# Patient Record
Sex: Male | Born: 1995 | Race: Black or African American | Hispanic: No | Marital: Single | Smoking: Never smoker
Health system: Southern US, Community
[De-identification: ages and names within clinical notes are randomized; demographics above are authoritative.]

## PROBLEM LIST (undated history)

## (undated) DIAGNOSIS — I309 Acute pericarditis, unspecified: Secondary | ICD-10-CM

## (undated) HISTORY — DX: Acute pericarditis, unspecified: I30.9

---
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
## 2013-08-17 ENCOUNTER — Emergency Department (HOSPITAL_COMMUNITY)
Admission: EM | Admit: 2013-08-17 | Discharge: 2013-08-17 | Disposition: A | Discharge status: Home / Self Care | Payer: Self-pay | Attending: Emergency Medicine | Admitting: Emergency Medicine

## 2013-08-17 ENCOUNTER — Emergency Department (HOSPITAL_COMMUNITY): Payer: Self-pay

## 2013-08-17 DIAGNOSIS — Y9389 Activity, other specified: Secondary | ICD-10-CM | POA: Insufficient documentation

## 2013-08-17 DIAGNOSIS — Y9289 Other specified places as the place of occurrence of the external cause: Secondary | ICD-10-CM | POA: Insufficient documentation

## 2013-08-17 DIAGNOSIS — Z23 Encounter for immunization: Secondary | ICD-10-CM | POA: Insufficient documentation

## 2013-08-17 DIAGNOSIS — W3400XA Accidental discharge from unspecified firearms or gun, initial encounter: Secondary | ICD-10-CM | POA: Insufficient documentation

## 2013-08-17 DIAGNOSIS — S71009A Unspecified open wound, unspecified hip, initial encounter: Secondary | ICD-10-CM

## 2013-08-17 DIAGNOSIS — S71132A Puncture wound without foreign body, left thigh, initial encounter: Secondary | ICD-10-CM

## 2013-08-17 DIAGNOSIS — S71109A Unspecified open wound, unspecified thigh, initial encounter: Principal | ICD-10-CM | POA: Insufficient documentation

## 2013-08-17 LAB — CBC
HCT: 42.6 % (ref 39.0–52.0)
Hemoglobin: 15.1 g/dL (ref 13.0–17.0)
MCH: 31.3 pg (ref 26.0–34.0)
MCHC: 35.4 g/dL (ref 30.0–36.0)
MCV: 88.4 fL (ref 78.0–100.0)
PLATELETS: 230 10*3/uL (ref 150–400)
RBC: 4.82 MIL/uL (ref 4.22–5.81)
RDW: 12.7 % (ref 11.5–15.5)
WBC: 5.7 10*3/uL (ref 4.0–10.5)

## 2013-08-17 LAB — TYPE AND SCREEN
ABO/RH(D): B POS
Antibody Screen: NEGATIVE
UNIT DIVISION: 0
Unit division: 0

## 2013-08-17 LAB — COMPREHENSIVE METABOLIC PANEL
ALK PHOS: 93 U/L (ref 39–117)
ALT: 12 U/L (ref 0–53)
ANION GAP: 18 — AB (ref 5–15)
AST: 18 U/L (ref 0–37)
Albumin: 4.4 g/dL (ref 3.5–5.2)
BUN: 12 mg/dL (ref 6–23)
CALCIUM: 9.2 mg/dL (ref 8.4–10.5)
CO2: 23 meq/L (ref 19–32)
Chloride: 99 mEq/L (ref 96–112)
Creatinine, Ser: 1 mg/dL (ref 0.50–1.35)
GFR calc non Af Amer: >90 mL/min (ref >90)
GLUCOSE: 114 mg/dL — AB (ref 70–99)
Potassium: 3.2 mEq/L — ABNORMAL LOW (ref 3.7–5.3)
SODIUM: 140 meq/L (ref 137–147)
TOTAL PROTEIN: 7.5 g/dL (ref 6.0–8.3)
Total Bilirubin: 0.4 mg/dL (ref 0.3–1.2)

## 2013-08-17 LAB — PREPARE FRESH FROZEN PLASMA
UNIT DIVISION: 0
Unit division: 0

## 2013-08-17 LAB — I-STAT CHEM 8, ED
BUN: 11 mg/dL (ref 6–23)
CALCIUM ION: 1.14 mmol/L (ref 1.12–1.23)
Chloride: 102 mEq/L (ref 96–112)
Creatinine, Ser: 1.1 mg/dL (ref 0.50–1.35)
GLUCOSE: 121 mg/dL — AB (ref 70–99)
HCT: 48 % (ref 39.0–52.0)
HEMOGLOBIN: 16.3 g/dL (ref 13.0–17.0)
Potassium: 3 mEq/L — ABNORMAL LOW (ref 3.7–5.3)
SODIUM: 141 meq/L (ref 137–147)
TCO2: 23 mmol/L (ref 0–100)

## 2013-08-17 LAB — ETHANOL: Alcohol, Ethyl (B): <11 mg/dL (ref 0–11)

## 2013-08-17 LAB — PROTIME-INR
INR: 0.95 (ref 0.00–1.49)
Prothrombin Time: 12.7 seconds (ref 11.6–15.2)

## 2013-08-17 LAB — CDS SEROLOGY

## 2013-08-17 LAB — ABO/RH: ABO/RH(D): B POS

## 2013-08-17 MED ORDER — TRAMADOL HCL 50 MG PO TABS: 50.0000 mg | ORAL_TABLET | Freq: Four times a day (QID) | ORAL | Status: DC | PRN

## 2013-08-17 MED ORDER — MORPHINE SULFATE 2 MG/ML IJ SOLN
INTRAMUSCULAR | Status: AC
  Filled 2013-08-17: qty 1

## 2013-08-17 MED ORDER — SODIUM CHLORIDE 0.9 % IV SOLN
INTRAVENOUS | Status: AC | PRN
  Administered 2013-08-17: 500 mL via INTRAVENOUS

## 2013-08-17 MED ORDER — MORPHINE SULFATE 2 MG/ML IJ SOLN
2.0000 mg | Freq: Once | INTRAMUSCULAR | Status: AC
  Administered 2013-08-17: 2 mg via INTRAVENOUS

## 2013-08-17 MED ORDER — CEPHALEXIN 500 MG PO CAPS: 500.0000 mg | ORAL_CAPSULE | Freq: Four times a day (QID) | ORAL | Status: DC

## 2013-08-17 MED ORDER — TETANUS-DIPHTH-ACELL PERTUSSIS 5-2.5-18.5 LF-MCG/0.5 IM SUSP
0.5000 mL | Freq: Once | INTRAMUSCULAR | Status: AC
  Administered 2013-08-17: 0.5 mL via INTRAMUSCULAR
  Filled 2013-08-17: qty 0.5

## 2013-08-17 MED ORDER — TRAMADOL HCL 50 MG PO TABS
50.0000 mg | ORAL_TABLET | Freq: Four times a day (QID) | ORAL | Status: DC | PRN
Start: 2013-08-17 — End: 2017-10-29

## 2013-08-17 MED ORDER — IBUPROFEN 600 MG PO TABS: 600.0000 mg | ORAL_TABLET | Freq: Four times a day (QID) | ORAL | Status: DC | PRN
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
## 2013-08-17 NOTE — ED Notes (Signed)
Pass Word - [phone]
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
## 2017-06-09 ENCOUNTER — Emergency Department (HOSPITAL_COMMUNITY): Payer: Self-pay

## 2017-06-09 ENCOUNTER — Other Ambulatory Visit: Payer: Self-pay

## 2017-06-09 ENCOUNTER — Encounter (HOSPITAL_COMMUNITY): Payer: Self-pay | Admitting: Emergency Medicine

## 2017-06-09 ENCOUNTER — Emergency Department (HOSPITAL_COMMUNITY)
Admission: EM | Admit: 2017-06-09 | Discharge: 2017-06-09 | Disposition: A | Discharge status: Home / Self Care | Payer: Self-pay | Attending: Emergency Medicine | Admitting: Emergency Medicine

## 2017-06-09 DIAGNOSIS — Z5321 Procedure and treatment not carried out due to patient leaving prior to being seen by health care provider: Secondary | ICD-10-CM | POA: Insufficient documentation

## 2017-06-09 DIAGNOSIS — S61210A Laceration without foreign body of right index finger without damage to nail, initial encounter: Secondary | ICD-10-CM | POA: Insufficient documentation

## 2017-06-09 DIAGNOSIS — Y929 Unspecified place or not applicable: Secondary | ICD-10-CM | POA: Insufficient documentation

## 2017-06-09 DIAGNOSIS — W25XXXA Contact with sharp glass, initial encounter: Secondary | ICD-10-CM | POA: Insufficient documentation

## 2017-06-09 DIAGNOSIS — Y9389 Activity, other specified: Secondary | ICD-10-CM | POA: Insufficient documentation

## 2017-06-09 DIAGNOSIS — Y998 Other external cause status: Secondary | ICD-10-CM | POA: Insufficient documentation
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
## 2017-06-09 NOTE — ED Notes (Signed)
Pt states he is leaving to be seen at another facility. Left with dressing intact and bleeding controlled.

## 2017-10-28 ENCOUNTER — Encounter (HOSPITAL_COMMUNITY): Payer: Self-pay | Admitting: *Deleted

## 2017-10-28 ENCOUNTER — Observation Stay (HOSPITAL_COMMUNITY)
Admission: EM | Admit: 2017-10-28 | Discharge: 2017-10-29 | Disposition: A | Discharge status: Home / Self Care | Payer: Self-pay | Attending: Internal Medicine | Admitting: Internal Medicine

## 2017-10-28 ENCOUNTER — Other Ambulatory Visit: Payer: Self-pay

## 2017-10-28 ENCOUNTER — Emergency Department (HOSPITAL_COMMUNITY): Payer: Self-pay

## 2017-10-28 DIAGNOSIS — R079 Chest pain, unspecified: Secondary | ICD-10-CM

## 2017-10-28 DIAGNOSIS — I309 Acute pericarditis, unspecified: Secondary | ICD-10-CM | POA: Diagnosis present

## 2017-10-28 DIAGNOSIS — I308 Other forms of acute pericarditis: Principal | ICD-10-CM | POA: Insufficient documentation

## 2017-10-28 DIAGNOSIS — F17200 Nicotine dependence, unspecified, uncomplicated: Secondary | ICD-10-CM | POA: Insufficient documentation

## 2017-10-28 DIAGNOSIS — I319 Disease of pericardium, unspecified: Secondary | ICD-10-CM

## 2017-10-28 HISTORY — DX: Acute pericarditis, unspecified: I30.9

## 2017-10-28 LAB — BASIC METABOLIC PANEL
ANION GAP: 8 (ref 5–15)
BUN: 6 mg/dL (ref 6–20)
CALCIUM: 9 mg/dL (ref 8.9–10.3)
CO2: 25 mmol/L (ref 22–32)
CREATININE: 0.94 mg/dL (ref 0.61–1.24)
Chloride: 102 mmol/L (ref 98–111)
Glucose, Bld: 116 mg/dL — ABNORMAL HIGH (ref 70–99)
Potassium: 3.2 mmol/L — ABNORMAL LOW (ref 3.5–5.1)
SODIUM: 135 mmol/L (ref 135–145)

## 2017-10-28 LAB — RAPID URINE DRUG SCREEN, HOSP PERFORMED
Amphetamines: NOT DETECTED
BENZODIAZEPINES: NOT DETECTED
Barbiturates: NOT DETECTED
Cocaine: NOT DETECTED
OPIATES: NOT DETECTED
Tetrahydrocannabinol: POSITIVE — AB

## 2017-10-28 LAB — CBC
HCT: 43.9 % (ref 39.0–52.0)
Hemoglobin: 14.7 g/dL (ref 13.0–17.0)
MCH: 29.6 pg (ref 26.0–34.0)
MCHC: 33.5 g/dL (ref 30.0–36.0)
MCV: 88.5 fL (ref 78.0–100.0)
PLATELETS: 264 10*3/uL (ref 150–400)
RBC: 4.96 MIL/uL (ref 4.22–5.81)
RDW: 12.6 % (ref 11.5–15.5)
WBC: 7.4 10*3/uL (ref 4.0–10.5)

## 2017-10-28 LAB — C-REACTIVE PROTEIN: CRP: 4.6 mg/dL — AB (ref <1.0)

## 2017-10-28 LAB — SEDIMENTATION RATE: SED RATE: 29 mm/h — AB (ref 0–16)

## 2017-10-28 LAB — TROPONIN I: TROPONIN I: 0.82 ng/mL — AB (ref <0.03)

## 2017-10-28 MED ORDER — ASPIRIN 81 MG PO CHEW
324.0000 mg | CHEWABLE_TABLET | Freq: Once | ORAL | Status: AC
  Administered 2017-10-28: 324 mg via ORAL
  Filled 2017-10-28: qty 4

## 2017-10-28 MED ORDER — HEPARIN BOLUS VIA INFUSION
4000.0000 [IU] | Freq: Once | INTRAVENOUS | Status: DC
  Filled 2017-10-28: qty 4000

## 2017-10-28 MED ORDER — COLCHICINE 0.6 MG PO TABS
0.6000 mg | ORAL_TABLET | Freq: Two times a day (BID) | ORAL | Status: DC
  Administered 2017-10-29: 0.6 mg via ORAL
  Filled 2017-10-28 (×2): qty 1

## 2017-10-28 MED ORDER — ONDANSETRON HCL 4 MG/2ML IJ SOLN: 4.0000 mg | Freq: Four times a day (QID) | INTRAMUSCULAR | Status: DC | PRN

## 2017-10-28 MED ORDER — ACETAMINOPHEN 325 MG PO TABS: 650.0000 mg | ORAL_TABLET | ORAL | Status: DC | PRN

## 2017-10-28 MED ORDER — HEPARIN (PORCINE) IN NACL 100-0.45 UNIT/ML-% IJ SOLN
950.0000 [IU]/h | INTRAMUSCULAR | Status: DC
  Filled 2017-10-28: qty 250

## 2017-10-28 MED ORDER — GI COCKTAIL ~~LOC~~
30.0000 mL | Freq: Once | ORAL | Status: AC
  Administered 2017-10-28: 30 mL via ORAL
  Filled 2017-10-28: qty 30

## 2017-10-28 MED ORDER — IBUPROFEN 800 MG PO TABS
800.0000 mg | ORAL_TABLET | Freq: Three times a day (TID) | ORAL | Status: DC
  Administered 2017-10-29 (×2): 800 mg via ORAL
  Filled 2017-10-28 (×2): qty 1

## 2017-10-28 MED ORDER — COLCHICINE 0.6 MG PO TABS
1.2000 mg | ORAL_TABLET | Freq: Once | ORAL | Status: AC
  Administered 2017-10-28: 1.2 mg via ORAL
  Filled 2017-10-28: qty 2

## 2017-10-28 MED ORDER — HYDROCODONE-ACETAMINOPHEN 5-325 MG PO TABS
1.0000 | ORAL_TABLET | Freq: Once | ORAL | Status: AC
  Administered 2017-10-28: 1 via ORAL
  Filled 2017-10-28: qty 1

## 2017-10-28 NOTE — Consult Note (Addendum)
Cardiology Consult    Patient ID: Steven Stuart MRN: [id number], DOB/AGE: [birth year]   Admit date: 10/28/2017 Date of Consult: 10/28/2017  Primary Physician: Patient, No Pcp Per Primary Cardiologist: No primary care provider on file. Requesting Provider: Sedonia Small  Patient Profile    Steven Stuart is a 22 y.o. male with no significant past medical history presents with new onset chest pain.  He reports that he had very bad bout of flulike illness 3 days ago.  He was febrile, had a cough and felt totally knocked out.  Symptoms lasted only for 1 day.  He had no sick contacts he can remember.  Is been feeling a little bit short of breath the last 2 days.   Then this afternoon at 3 PM he developed acute onset of chest pain.  Pain got worse with lying flat and better with sitting upright.  Taking deep breaths did make a difference.  Pain is nonradiating.  He did have some numbness in his left and right hand.  He denies fever chills or night sweats in the last 24 hours.  He denies acute shortness of breath.  No lower extremity edema.  No dizziness or syncope.  No orthopnea or PND.  No recent medication use or illicit drug use.  He has quite a bit of stressors at home with work and 5 kids the youngest 90 months old.  Past Medical History   History reviewed. No pertinent past medical history.  History reviewed. No pertinent surgical history.   Allergies  No Known Allergies  Inpatient Medications    . colchicine  1.2 mg Oral Once    Family History    Uncle died at age 32 of missed heart attack  Social History    Social History   Socioeconomic History  . Marital status: Single    Spouse name: Not on file  . Number of children: Not on file  . Years of education: Not on file  . Highest education level: Not on file  Occupational History  . Not on file  Social Needs  . Financial resource strain: Not on file  . Food insecurity:    Worry: Not on file    Inability: Not on file  .  Transportation needs:    Medical: Not on file    Non-medical: Not on file  Tobacco Use  . Smoking status: Current Every Day Smoker  . Smokeless tobacco: Never Used  Substance and Sexual Activity  . Alcohol use: Yes  . Drug use: Not on file  . Sexual activity: Not on file  Lifestyle  . Physical activity:    Days per week: Not on file    Minutes per session: Not on file  . Stress: Not on file  Relationships  . Social connections:    Talks on phone: Not on file    Gets together: Not on file    Attends religious service: Not on file    Active member of club or organization: Not on file    Attends meetings of clubs or organizations: Not on file    Relationship status: Not on file  . Intimate partner violence:    Fear of current or ex partner: Not on file    Emotionally abused: Not on file    Physically abused: Not on file    Forced sexual activity: Not on file  Other Topics Concern  . Not on file  Social History Narrative  . Not on file     Review  of Systems    General:  No chills, fever, night sweats or weight changes.  Cardiovascular:  No chest pain, dyspnea on exertion, edema, orthopnea, palpitations, paroxysmal nocturnal dyspnea. Dermatological: No rash, lesions/masses Respiratory: No cough, dyspnea Urologic: No hematuria, dysuria Abdominal:   No nausea, vomiting, diarrhea, bright red blood per rectum, melena, or hematemesis Neurologic:  No visual changes, wkns, changes in mental status. All other systems reviewed and are otherwise negative except as noted above.  Physical Exam    Blood pressure (!) 160/98, pulse (!) 56, temperature 99.1 F (37.3 C), temperature source Oral, resp. rate 18, height 5' 10" (1.778 m), weight 81.6 kg, SpO2 98 %.  General: Pleasant, NAD Psych: Normal affect. Neuro: Alert and oriented X 3. Moves all extremities spontaneously. HEENT: Normal  Neck: Supple without bruits or JVD. Lungs:  Resp regular and unlabored, CTA. Heart: RRR no s3,  s4, or murmurs. Abdomen: Soft, non-tender, non-distended, BS + x 4.  Extremities: No clubbing, cyanosis or edema. DP/PT/Radials 2+ and equal bilaterally.  Labs    Troponin (Point of Care Test) No results for input(s): TROPIPOC in the last 72 hours. Recent Labs    10/28/17 1838  TROPONINI 0.82*   Lab Results  Component Value Date   WBC 7.4 10/28/2017   HGB 14.7 10/28/2017   HCT 43.9 10/28/2017   MCV 88.5 10/28/2017   PLT 264 10/28/2017    Recent Labs  Lab 10/28/17 1838  NA 135  K 3.2*  CL 102  CO2 25  BUN 6  CREATININE 0.94  CALCIUM 9.0  GLUCOSE 116*   No results found for: CHOL, HDL, LDLCALC, TRIG No results found for: DDIMER   Radiology Studies    Dg Chest 2 View  Result Date: 10/28/2017 CLINICAL DATA:  Mid chest pain for 4 hours, awoke from sleep with the pain, some shortness of breath, BILATERAL arm pain, smoker EXAM: CHEST - 2 VIEW COMPARISON:  03/30/2009 FINDINGS: Upper normal heart size. Mediastinal contours and pulmonary vascularity normal. Lungs clear. No pleural effusion or pneumothorax. Bones unremarkable. IMPRESSION: No acute abnormalities. Electronically Signed   By: Lavonia Dana M.D.   On: 10/28/2017 19:42    ECG & Cardiac Imaging    ST elevation in anterior and inferior and lateral leads.  PR elevation in aVR.- personally reviewed.  Assessment & Plan    Chest pain: This presentation is most likely due to myopericarditis.  He does have physical exam signs suggestive of pericarditis.  His troponin elevation is suggestive of the myocarditis portion.  His ECG supports pericarditis.  He is currently in no chest pain following a GI cocktail.  He is free of any physical distress.  I performed a bedside ultrasound of his heart.  He had good windows.  His LV function and RV function were preserved.  He had mild pericardial effusion.  Recommendations: -Admit for observation overnight -Official TTE -Serial enzymes and ECG -Can start on colchicine and treat him  for the next month -Stop heparin -Recommend checking ESR, CRP, flu and flulike viruses.  Signed, Cristina Gong, MD 10/28/2017, 9:00 PM  For questions or updates, please contact   Please consult [email or web address] for contact info under Cardiology/STEMI.

## 2017-10-28 NOTE — ED Provider Notes (Signed)
[facility] Emergency Department Provider Note MRN:  [id number]  Arrival date & time: 10/28/17     Chief Complaint   Chest Pain   History of Present Illness   Steven Stuart is a 22 y.o. year-old male with no pertinent past medical history presenting to the ED with chief complaint of chest pain.  The pain is located in the central chest.  Described as a pressure-like sensation.  The pain is nonradiating.  It began 4 to 5 hours ago suddenly.  Worse with laying flat.  Not worse with deep breathing.  No associated symptoms.  Recent viral illness 1 to 2 weeks ago.  Review of Systems  A complete 10 system review of systems was obtained and all systems are negative except as noted in the HPI and PMH.   Patient's Health History   History reviewed. No pertinent past medical history.  History reviewed. No pertinent surgical history.  Family History  Problem Relation Age of Onset  . Heart attack Father 65    Social History   Socioeconomic History  . Marital status: Single    Spouse name: Not on file  . Number of children: Not on file  . Years of education: Not on file  . Highest education level: Not on file  Occupational History  . Not on file  Social Needs  . Financial resource strain: Not on file  . Food insecurity:    Worry: Not on file    Inability: Not on file  . Transportation needs:    Medical: Not on file    Non-medical: Not on file  Tobacco Use  . Smoking status: Current Every Day Smoker  . Smokeless tobacco: Never Used  Substance and Sexual Activity  . Alcohol use: Yes  . Drug use: Not on file  . Sexual activity: Not on file  Lifestyle  . Physical activity:    Days per week: Not on file    Minutes per session: Not on file  . Stress: Not on file  Relationships  . Social connections:    Talks on phone: Not on file    Gets together: Not on file    Attends religious service: Not on file    Active member of club or organization: Not on file   Attends meetings of clubs or organizations: Not on file    Relationship status: Not on file  . Intimate partner violence:    Fear of current or ex partner: Not on file    Emotionally abused: Not on file    Physically abused: Not on file    Forced sexual activity: Not on file  Other Topics Concern  . Not on file  Social History Narrative  . Not on file     Physical Exam  Vital Signs and Nursing Notes reviewed Vitals:   10/28/17 2200 10/28/17 2233  BP: 126/82 121/86  Pulse: (!) 51 (!) 52  Resp: 19 17  Temp:  98.4 F (36.9 C)  SpO2: 98% 99%    CONSTITUTIONAL: Well-appearing, NAD NEURO:  Alert and oriented x 3, no focal deficits EYES:  eyes equal and reactive ENT/NECK:  no LAD, no JVD CARDIO: Regular rate, well-perfused, normal S1 and S2, anterior chest nontender to palpation PULM:  CTAB no wheezing or rhonchi GI/GU:  normal bowel sounds, non-distended, non-tender MSK/SPINE:  No gross deformities, no edema SKIN:  no rash, atraumatic PSYCH:  Appropriate speech and behavior  Diagnostic and Interventional Summary    EKG Interpretation  Date/Time:  Saturday October 28 2017 18:33:10 EDT Ventricular Rate:  55 PR Interval:  72 QRS Duration: 96 QT Interval:  404 QTC Calculation: 386 R Axis:   96 Text Interpretation:  Sinus bradycardia with short PR Rightward axis Borderline ECG Confirmed by Kennis Carina [phone]) on 10/28/2017 7:21:21 PM       EKG Interpretation  Date/Time:  Saturday October 28 2017 19:58:49 EDT Ventricular Rate:  51 PR Interval:  124 QRS Duration: 100 QT Interval:  408 QTC Calculation: 376 R Axis:   89 Text Interpretation:  Sinus bradycardia Septal infarct , age undetermined Abnormal ECG Confirmed by Kennis Carina [phone]) on 10/28/2017 7:59:13 PM       Labs Reviewed  BASIC METABOLIC PANEL - Abnormal; Notable for the following components:      Result Value   Potassium 3.2 (*)    Glucose, Bld 116 (*)    All other components within normal limits    TROPONIN I - Abnormal; Notable for the following components:   Troponin I 0.82 (*)    All other components within normal limits  RAPID URINE DRUG SCREEN, HOSP PERFORMED - Abnormal; Notable for the following components:   Tetrahydrocannabinol POSITIVE (*)    All other components within normal limits  SEDIMENTATION RATE - Abnormal; Notable for the following components:   Sed Rate 29 (*)    All other components within normal limits  C-REACTIVE PROTEIN - Abnormal; Notable for the following components:   CRP 4.6 (*)    All other components within normal limits  RESPIRATORY PANEL BY PCR  CBC  HIV ANTIBODY (ROUTINE TESTING W REFLEX)  TROPONIN I  TROPONIN I    DG Chest 2 View  Final Result      Medications  colchicine tablet 0.6 mg (has no administration in time range)  ibuprofen (ADVIL,MOTRIN) tablet 800 mg (has no administration in time range)  acetaminophen (TYLENOL) tablet 650 mg (has no administration in time range)  ondansetron (ZOFRAN) injection 4 mg (has no administration in time range)  gi cocktail (Maalox,Lidocaine,Donnatal) (30 mLs Oral Given 10/28/17 1947)  HYDROcodone-acetaminophen (NORCO/VICODIN) 5-325 MG per tablet 1 tablet (1 tablet Oral Given 10/28/17 1946)  aspirin chewable tablet 324 mg (324 mg Oral Given 10/28/17 1949)  colchicine tablet 1.2 mg (1.2 mg Oral Given 10/28/17 2100)     Procedures Critical Care  ED Course and Medical Decision Making  I have reviewed the triage vital signs and the nursing notes.  Pertinent labs & imaging results that were available during my care of the patient were reviewed by me and considered in my medical decision making (see below for details).  Favoring GI origin of pain given the worse with laying flat in nature, will trial GI cocktail.  However patient appears uncomfortable, examined consistent with MSK pain, will screen with EKG, labs.  Not hypoxic, not tachycardic, no evidence of DVT, PERC negative.  EKG with J-point elevation  inferiorly, troponin elevated 0.8.  Patient given aspirin, feeling better after GI cocktail.  Cardiology consulted.  Repeat EKG showing more diffuse ST elevations with favorable morphology suggesting myopericarditis.  Patient has had a recent viral syndrome.  Cardiology recommending serial EKGs, trend troponins, colchicine.  Will admit to medicine.  Elmer Sow. Pilar Plate, MD [facility] Emergency Medicine [facility] [email or web address]  Final Clinical Impressions(s) / ED Diagnoses     ICD-10-CM   1. Myopericarditis I31.9   2. Chest pain, unspecified type R07.9     ED Discharge Orders    None  Sabas Sous, MD 10/28/17 2235

## 2017-10-28 NOTE — ED Notes (Signed)
Pt complains of chest pain while lying down. Pt reports no hx of same.

## 2017-10-28 NOTE — ED Triage Notes (Signed)
The pt is c/o pain in his mid-chest since he woke up 4 hours ago  No pat hx  /sob  None present now

## 2017-10-28 NOTE — ED Notes (Signed)
Pain is worse when lying  He denies drug use today  He smoked pot yesterday

## 2017-10-28 NOTE — ED Notes (Signed)
Pt transported to XR. Unable to administer medications as this time.

## 2017-10-28 NOTE — H&P (Signed)
History and Physical    Steven Stuart YQM:[id number] DOB: August 23, 1995 DOA: 10/28/2017  PCP: Patient, No Pcp Per  Patient coming from: Home  I have personally briefly reviewed patient's old medical records in [facility]  Chief Complaint: Chest pain  HPI: Steven Stuart is a 22 y.o. male with medical history significant of previously healthy.  Patient had URI 3 days ago with fever, cough.  Symptoms lasted for about 1 day.  SOB for past 2 days, developed CP at 3pm today.  Pain worse lying flat and better sitting upright.  No radiation.  No peripheral edema.   ED Course: EKG showing ST elevations in multi leads, Trop 0.83.  Cards came down, bedside echo confirms small pericardial effusion.  Patient being admitted for myopericarditis.   Review of Systems: As per HPI otherwise 10 point review of systems negative.   History reviewed. No pertinent past medical history.  History reviewed. No pertinent surgical history.   reports that he has been smoking. He has never used smokeless tobacco. He reports that he drinks alcohol. His drug history is not on file.  No Known Allergies  Family History  Problem Relation Age of Onset  . Heart attack Father 33     Prior to Admission medications   Medication Sig Start Date End Date Taking? Authorizing Provider  aspirin-acetaminophen-caffeine (EXCEDRIN MIGRAINE) [phone] MG tablet Take 1 tablet by mouth daily as needed for headache.   Yes [provider]  cephALEXin (KEFLEX) 500 MG capsule Take 1 capsule (500 mg total) by mouth 4 (four) times daily. Patient not taking: Reported on 10/28/2017 08/17/13   Derwood Kaplan, MD  cephALEXin (KEFLEX) 500 MG capsule Take 1 capsule (500 mg total) by mouth 4 (four) times daily. Patient not taking: Reported on 10/28/2017 08/17/13   Derwood Kaplan, MD  ibuprofen (ADVIL,MOTRIN) 600 MG tablet Take 1 tablet (600 mg total) by mouth every 6 (six) hours as needed. Patient not taking: Reported on 10/28/2017  08/17/13   Derwood Kaplan, MD  traMADol (ULTRAM) 50 MG tablet Take 1 tablet (50 mg total) by mouth every 6 (six) hours as needed. Patient not taking: Reported on 10/28/2017 08/17/13   Derwood Kaplan, MD  traMADol (ULTRAM) 50 MG tablet Take 1 tablet (50 mg total) by mouth every 6 (six) hours as needed. Patient not taking: Reported on 10/28/2017 08/17/13   Derwood Kaplan, MD    Physical Exam: Vitals:   10/28/17 1833 10/28/17 1838  BP: (!) 160/98   Pulse: (!) 56   Resp: 18   Temp: 99.1 F (37.3 C)   TempSrc: Oral   SpO2: 98%   Weight:  81.6 kg  Height:  5\' 10"  (1.778 m)    Constitutional: NAD, calm, comfortable Eyes: PERRL, lids and conjunctivae normal ENMT: Mucous membranes are moist. Posterior pharynx clear of any exudate or lesions.Normal dentition.  Neck: normal, supple, no masses, no thyromegaly Respiratory: clear to auscultation bilaterally, no wheezing, no crackles. Normal respiratory effort. No accessory muscle use.  Cardiovascular: Regular rate and rhythm, no murmurs / rubs / gallops. No extremity edema. 2+ pedal pulses. No carotid bruits.  Abdomen: no tenderness, no masses palpated. No hepatosplenomegaly. Bowel sounds positive.  Musculoskeletal: no clubbing / cyanosis. No joint deformity upper and lower extremities. Good ROM, no contractures. Normal muscle tone.  Skin: no rashes, lesions, ulcers. No induration Neurologic: CN 2-12 grossly intact. Sensation intact, DTR normal. Strength 5/5 in all 4.  Psychiatric: Normal judgment and insight. Alert and oriented x 3. Normal mood.  Labs on Admission: I have personally reviewed following labs and imaging studies  CBC: Recent Labs  Lab 10/28/17 1838  WBC 7.4  HGB 14.7  HCT 43.9  MCV 88.5  PLT 264   Basic Metabolic Panel: Recent Labs  Lab 10/28/17 1838  NA 135  K 3.2*  CL 102  CO2 25  GLUCOSE 116*  BUN 6  CREATININE 0.94  CALCIUM 9.0   GFR: Estimated Creatinine Clearance: 125.1 mL/min (by C-G formula based  on SCr of 0.94 mg/dL). Liver Function Tests: No results for input(s): AST, ALT, ALKPHOS, BILITOT, PROT, ALBUMIN in the last 168 hours. No results for input(s): LIPASE, AMYLASE in the last 168 hours. No results for input(s): AMMONIA in the last 168 hours. Coagulation Profile: No results for input(s): INR, PROTIME in the last 168 hours. Cardiac Enzymes: Recent Labs  Lab 10/28/17 1838  TROPONINI 0.82*   BNP (last 3 results) No results for input(s): PROBNP in the last 8760 hours. HbA1C: No results for input(s): HGBA1C in the last 72 hours. CBG: No results for input(s): GLUCAP in the last 168 hours. Lipid Profile: No results for input(s): CHOL, HDL, LDLCALC, TRIG, CHOLHDL, LDLDIRECT in the last 72 hours. Thyroid Function Tests: No results for input(s): TSH, T4TOTAL, FREET4, T3FREE, THYROIDAB in the last 72 hours. Anemia Panel: No results for input(s): VITAMINB12, FOLATE, FERRITIN, TIBC, IRON, RETICCTPCT in the last 72 hours. Urine analysis:    Component Value Date/Time   COLORURINE AMBER (A) 04/30/2011 0053   APPEARANCEUR CLOUDY (A) 04/30/2011 0053   LABSPEC 1.040 (H) 04/30/2011 0053   PHURINE 6.5 04/30/2011 0053   GLUCOSEU NEGATIVE 04/30/2011 0053   HGBUR NEGATIVE 04/30/2011 0053   BILIRUBINUR SMALL (A) 04/30/2011 0053   KETONESUR 15 (A) 04/30/2011 0053   PROTEINUR 100 (A) 04/30/2011 0053   UROBILINOGEN 1.0 04/30/2011 0053   NITRITE NEGATIVE 04/30/2011 0053   LEUKOCYTESUR NEGATIVE 04/30/2011 0053    Radiological Exams on Admission: Dg Chest 2 View  Result Date: 10/28/2017 CLINICAL DATA:  Mid chest pain for 4 hours, awoke from sleep with the pain, some shortness of breath, BILATERAL arm pain, smoker EXAM: CHEST - 2 VIEW COMPARISON:  03/30/2009 FINDINGS: Upper normal heart size. Mediastinal contours and pulmonary vascularity normal. Lungs clear. No pleural effusion or pneumothorax. Bones unremarkable. IMPRESSION: No acute abnormalities. Electronically Signed   By: Ulyses Southward  M.D.   On: 10/28/2017 19:42    EKG: Independently reviewed.  Assessment/Plan Active Problems:   Acute myopericarditis    1. Acute myopericarditis - 1. Tele monitor 2. Serial trops 3. Colchicine 4. Will add Ibuprofen 5. EKG repeat in AM  DVT prophylaxis: SCDs Code Status: Full Family Communication: No family in room Disposition Plan: Home after admit Consults called: Cards Admission status: Place in obs   Hillary Bow. DO [facility] Pager [phone] Only works nights!  If 7AM-7PM, please contact the primary day team physician taking care of patient  [email or web address] Password [email or web address]  10/28/2017, 9:54 PM

## 2017-10-28 NOTE — Progress Notes (Signed)
ANTICOAGULATION CONSULT NOTE - Initial Consult  Pharmacy Consult for heparin Indication: chest pain/ACS  No Known Allergies  Patient Measurements: Height: 5\' 10"  (177.8 cm) Weight: 180 lb (81.6 kg) IBW/kg (Calculated) : 73 Heparin Dosing Weight: 82kg  Vital Signs: Temp: 99.1 F (37.3 C) (10/05 1833) Temp Source: Oral (10/05 1833) BP: 160/98 (10/05 1833) Pulse Rate: 56 (10/05 1833)  Labs: Recent Labs    10/28/17 1838  HGB 14.7  HCT 43.9  PLT 264  CREATININE 0.94  TROPONINI 0.82*    Estimated Creatinine Clearance: 125.1 mL/min (by C-G formula based on SCr of 0.94 mg/dL).   Medical History: History reviewed. No pertinent past medical history.   Assessment: 16 yoM admitted with CP and elevated troponins. Pharmacy consulted to start IV heparin. CBC wnl, no OAC PTA.  Goal of Therapy:  Heparin level 0.3-0.7 units/ml Monitor platelets by anticoagulation protocol: Yes   Plan:  -Heparin 4000 units x1 -Heparin 950 units/hr -Check 6-hr heparin level -Monitor heparin level, CBC, S/Sx bleeding daily  Fredonia Highland, PharmD, BCPS Clinical Pharmacist [phone] Please check AMION for all [facility] Pharmacy numbers 10/28/2017

## 2017-10-29 ENCOUNTER — Observation Stay (HOSPITAL_BASED_OUTPATIENT_CLINIC_OR_DEPARTMENT_OTHER): Payer: Self-pay

## 2017-10-29 ENCOUNTER — Other Ambulatory Visit: Payer: Self-pay

## 2017-10-29 DIAGNOSIS — I309 Acute pericarditis, unspecified: Secondary | ICD-10-CM

## 2017-10-29 LAB — ECHOCARDIOGRAM COMPLETE
Height: 70 in
Weight: 3081.6 oz

## 2017-10-29 LAB — RESPIRATORY PANEL BY PCR
Adenovirus: NOT DETECTED
Bordetella pertussis: NOT DETECTED
CORONAVIRUS 229E-RVPPCR: NOT DETECTED
CORONAVIRUS OC43-RVPPCR: NOT DETECTED
Chlamydophila pneumoniae: NOT DETECTED
Coronavirus HKU1: NOT DETECTED
Coronavirus NL63: NOT DETECTED
INFLUENZA B-RVPPCR: NOT DETECTED
Influenza A: NOT DETECTED
MYCOPLASMA PNEUMONIAE-RVPPCR: NOT DETECTED
Metapneumovirus: NOT DETECTED
PARAINFLUENZA VIRUS 1-RVPPCR: NOT DETECTED
Parainfluenza Virus 2: NOT DETECTED
Parainfluenza Virus 3: NOT DETECTED
Parainfluenza Virus 4: NOT DETECTED
RESPIRATORY SYNCYTIAL VIRUS-RVPPCR: NOT DETECTED
Rhinovirus / Enterovirus: NOT DETECTED

## 2017-10-29 LAB — MAGNESIUM: MAGNESIUM: 2.2 mg/dL (ref 1.7–2.4)

## 2017-10-29 LAB — TROPONIN I
TROPONIN I: 2.96 ng/mL — AB (ref <0.03)
TROPONIN I: 2.11 ng/mL — AB (ref <0.03)

## 2017-10-29 LAB — BASIC METABOLIC PANEL
Anion gap: 7 (ref 5–15)
BUN: 5 mg/dL — AB (ref 6–20)
CO2: 28 mmol/L (ref 22–32)
CREATININE: 1.01 mg/dL (ref 0.61–1.24)
Calcium: 8.7 mg/dL — ABNORMAL LOW (ref 8.9–10.3)
Chloride: 102 mmol/L (ref 98–111)
GFR calc Af Amer: >60 mL/min (ref >60)
Glucose, Bld: 95 mg/dL (ref 70–99)
POTASSIUM: 3.2 mmol/L — AB (ref 3.5–5.1)
SODIUM: 137 mmol/L (ref 135–145)

## 2017-10-29 LAB — HIV ANTIBODY (ROUTINE TESTING W REFLEX): HIV SCREEN 4TH GENERATION: NONREACTIVE

## 2017-10-29 MED ORDER — POTASSIUM CHLORIDE CRYS ER 20 MEQ PO TBCR
40.0000 meq | EXTENDED_RELEASE_TABLET | Freq: Once | ORAL | Status: AC
  Administered 2017-10-29: 40 meq via ORAL
  Filled 2017-10-29: qty 2

## 2017-10-29 MED ORDER — PANTOPRAZOLE SODIUM 40 MG PO TBEC: 40.0000 mg | DELAYED_RELEASE_TABLET | Freq: Every day | ORAL | 0 refills | Status: AC

## 2017-10-29 MED ORDER — POTASSIUM CHLORIDE CRYS ER 20 MEQ PO TBCR
20.0000 meq | EXTENDED_RELEASE_TABLET | Freq: Once | ORAL | Status: AC
  Administered 2017-10-29: 20 meq via ORAL
  Filled 2017-10-29: qty 1

## 2017-10-29 MED ORDER — IBUPROFEN 600 MG PO TABS: 600.0000 mg | ORAL_TABLET | Freq: Two times a day (BID) | ORAL | 0 refills | Status: AC

## 2017-10-29 MED ORDER — COLCHICINE 0.6 MG PO TABS: 0.6000 mg | ORAL_TABLET | Freq: Two times a day (BID) | ORAL | 0 refills | Status: AC

## 2017-10-29 NOTE — Discharge Instructions (Signed)
Follow with Primary MD  in 7 days   Get CBC, CMP, by Primary MD  in 5-7 days    Activity: As tolerated with Full fall precautions use walker/cane & assistance as needed  Disposition Home    Diet: Heart Healthy    Special Instructions: If you have smoked or chewed Tobacco  in the last 2 yrs please stop smoking, stop any regular Alcohol  and or any Recreational drug use.  On your next visit with your primary care physician please Get Medicines reviewed and adjusted.  Please request your Prim.MD to go over all Hospital Tests and Procedure/Radiological results at the follow up, please get all Hospital records sent to your Prim MD by signing hospital release before you go home.  If you experience worsening of your admission symptoms, develop shortness of breath, life threatening emergency, suicidal or homicidal thoughts you must seek medical attention immediately by calling 911 or calling your MD immediately  if symptoms less severe.  You Must read complete instructions/literature along with all the possible adverse reactions/side effects for all the Medicines you take and that have been prescribed to you. Take any new Medicines after you have completely understood and accpet all the possible adverse reactions/side effects.

## 2017-10-29 NOTE — Progress Notes (Signed)
Progress Note  Patient Name: Steven Stuart Date of Encounter: 10/29/2017  Primary Cardiologist: Donato Schultz, MD   Subjective   Recent acute chest pain yesterday afternoon worse with laying flat better sitting upright no change with deep breathing.  Recent flulike illness.  Currently laying flat, comfortable in bed.  Ready to take a shower.  Feels much better.  Inpatient Medications    Scheduled Meds: . colchicine  0.6 mg Oral BID  . ibuprofen  800 mg Oral TID   Continuous Infusions:  PRN Meds: acetaminophen, ondansetron (ZOFRAN) IV   Vital Signs    Vitals:   10/28/17 2233 10/29/17 0505 10/29/17 0700 10/29/17 0759  BP: 121/86 116/81  116/63  Pulse: (!) 52 (!) 53 (!) 51 (!) 51  Resp: 17 (!) 21    Temp: 98.4 F (36.9 C) (!) 97.5 F (36.4 C)  97.7 F (36.5 C)  TempSrc: Oral Oral  Oral  SpO2: 99% 100%  99%  Weight:  87.4 kg    Height:        Intake/Output Summary (Last 24 hours) at 10/29/2017 1046 Last data filed at 10/28/2017 2300 Gross per 24 hour  Intake 360 ml  Output -  Net 360 ml   Filed Weights   10/28/17 1838 10/29/17 0505  Weight: 81.6 kg 87.4 kg    Telemetry    Sinus bradycardia, sinus rhythm, no adverse arrhythmias- Personally Reviewed  ECG    Diffuse J-point ST segment elevation, mildly improved this morning- Personally Reviewed  Physical Exam   GEN: No acute distress.   Neck: No JVD Cardiac: RRR, no murmurs, no rubs, or gallops.  Respiratory: Clear to auscultation bilaterally. GI: Soft, nontender, non-distended  MS: No edema; No deformity. Neuro:  Nonfocal  Psych: Normal affect   Labs    Chemistry Recent Labs  Lab 10/28/17 1838 10/29/17 0704  NA 135 137  K 3.2* 3.2*  CL 102 102  CO2 25 28  GLUCOSE 116* 95  BUN 6 5*  CREATININE 0.94 1.01  CALCIUM 9.0 8.7*  GFRNONAA >60 >60  GFRAA >60 >60  ANIONGAP 8 7     Hematology Recent Labs  Lab 10/28/17 1838  WBC 7.4  RBC 4.96  HGB 14.7  HCT 43.9  MCV 88.5  MCH 29.6    MCHC 33.5  RDW 12.6  PLT 264    Cardiac Enzymes Recent Labs  Lab 10/28/17 1838 10/29/17 0238 10/29/17 0704  TROPONINI 0.82* 2.96* 2.11*   No results for input(s): TROPIPOC in the last 168 hours.   BNPNo results for input(s): BNP, PROBNP in the last 168 hours.   DDimer No results for input(s): DDIMER in the last 168 hours.   Radiology    Dg Chest 2 View  Result Date: 10/28/2017 CLINICAL DATA:  Mid chest pain for 4 hours, awoke from sleep with the pain, some shortness of breath, BILATERAL arm pain, smoker EXAM: CHEST - 2 VIEW COMPARISON:  03/30/2009 FINDINGS: Upper normal heart size. Mediastinal contours and pulmonary vascularity normal. Lungs clear. No pleural effusion or pneumothorax. Bones unremarkable. IMPRESSION: No acute abnormalities. Electronically Signed   By: Ulyses Southward M.D.   On: 10/28/2017 19:42    Cardiac Studies   Echocardiogram 10/29/2017: - Normal EF 50 to 55%.  No pericardial effusion.  Reassuring  Patient Profile     22 y.o. male with acute myopericarditis  Assessment & Plan    Acute myopericarditis -Recent viral illness is the culprit.  Currently chest pain-free. - Agree with colchicine,  ibuprofen for a total of 10 days. -Chest pain has already resolved with anti-inflammatory. -He feels well ambulating well.  Would like to take a shower.  This is fine with me. -Peak troponin 2.8, currently trending downward -Echocardiogram overall reassuring with no evidence of pericardial effusion.  Normal ejection fraction. -No adverse arrhythmias on telemetry. - Sed rate 29, fairly insignificant.  CRP 4.6 mildly elevated.  White blood cell count normal.  Respiratory viral panel was negative - Would advise that he refrain from heavy physical/exertional activity for the next 10 days.  Hypokalemia -Repleting.  THC positive - Encourage smoking cessation  I am comfortable with his discharge home.  We will have follow-up arranged.  [facility] will sign off.    Medication Recommendations: Colchicine, ibuprofen for a total of 10 days Other recommendations (labs, testing, etc): No further labs Follow up as an outpatient: We will arrange follow-up in our clinic.     For questions or updates, please contact [facility] Please consult [email or web address] for contact info under        Signed, Donato Schultz, MD  10/29/2017, 10:46 AM

## 2017-10-29 NOTE — Progress Notes (Signed)
Troponin 2.96, reported to NP Blount.  No new orders given.  Patient asleep and asymptomatic.

## 2017-10-29 NOTE — Progress Notes (Signed)
  Echocardiogram 2D Echocardiogram has been performed.  Steven Stuart L Androw 10/29/2017, 10:32 AM

## 2017-10-29 NOTE — Discharge Summary (Signed)
Steven Stuart NOM:[id number] DOB: August 30, 1995 DOA: 10/28/2017  PCP: Patient, No Pcp Per  Admit date: 10/28/2017  Discharge date: 10/29/2017  Admitted From: Home   Disposition:  Home   Recommendations for Outpatient Follow-up:   Follow up with PCP in 1-2 weeks  PCP Please obtain BMP/CBC, 2 view CXR in 1week,  (see Discharge instructions)   PCP Please follow up on the following pending results:    Home Health: None   Equipment/Devices: None  Consultations: Cards Discharge Condition: Stable   CODE STATUS: Full Diet Recommendation: Heart Healthy    Chief Complaint  Patient presents with  . Chest Pain     Brief history of present illness from the day of admission and additional interim summary    Steven Stuart is a 22 y.o. male with medical history significant of previously healthy.  Patient had URI 3 days ago with fever, cough.  Symptoms lasted for about 1 day.  SOB for past 2 days, developed CP at 3pm today.  Pain worse lying flat and better sitting upright.  No radiation.  No peripheral edema.   ED Course: EKG showing ST elevations in multi leads, Trop 0.83.  Cards came down, bedside echo confirms small pericardial effusion.  Patient being admitted for myopericarditis.                                                                  Hospital Course    1.  Positional pleuritic chest pain with ST elevation on EKG which was rather diffuse, elevated ESR and CRP along with nonspecific elevation of troponin.  All suggestive of myopericarditis, seen by cardiology, echo unremarkable, placed on combination of NSAIDs and colchicine with good effect, symptom-free now, cleared by cardiology to be discharged home.  Will get a 10-day supply of scheduled NSAID along with colchicine for 2 weeks.  Will follow with  cardiology in 1 to 2 weeks in the office post discharge.  Requested to quit smoking.   Discharge diagnosis     Active Problems:   Acute myopericarditis    Discharge instructions    Discharge Instructions    Diet - low sodium heart healthy   Complete by:  As directed    Discharge instructions   Complete by:  As directed    Follow with Primary MD  in 7 days   Get CBC, CMP, by Primary MD  in 5-7 days    Activity: As tolerated with Full fall precautions use walker/cane & assistance as needed  Disposition Home    Diet: Heart Healthy    Special Instructions: If you have smoked or chewed Tobacco  in the last 2 yrs please stop smoking, stop any regular Alcohol  and or any Recreational drug use.  On your next visit with your primary care physician please  Get Medicines reviewed and adjusted.  Please request your Prim.MD to go over all Hospital Tests and Procedure/Radiological results at the follow up, please get all Hospital records sent to your Prim MD by signing hospital release before you go home.  If you experience worsening of your admission symptoms, develop shortness of breath, life threatening emergency, suicidal or homicidal thoughts you must seek medical attention immediately by calling 911 or calling your MD immediately  if symptoms less severe.  You Must read complete instructions/literature along with all the possible adverse reactions/side effects for all the Medicines you take and that have been prescribed to you. Take any new Medicines after you have completely understood and accpet all the possible adverse reactions/side effects.   Increase activity slowly   Complete by:  As directed       Discharge Medications   Allergies as of 10/29/2017   No Known Allergies     Medication List    STOP taking these medications   aspirin-acetaminophen-caffeine 250-250-65 MG tablet Commonly known as:  EXCEDRIN MIGRAINE   cephALEXin 500 MG capsule Commonly known as:   KEFLEX   traMADol 50 MG tablet Commonly known as:  ULTRAM     TAKE these medications   colchicine 0.6 MG tablet Take 1 tablet (0.6 mg total) by mouth 2 (two) times daily.   ibuprofen 600 MG tablet Commonly known as:  ADVIL,MOTRIN Take 1 tablet (600 mg total) by mouth 2 (two) times daily. What changed:    when to take this  reasons to take this   pantoprazole 40 MG tablet Commonly known as:  PROTONIX Take 1 tablet (40 mg total) by mouth daily.       Follow-up Information    Jerline Pain, MD. Schedule an appointment as soon as possible for a visit in 1 week(s).   Specialty:  Cardiology Contact information: [address] [place] [address] [phone]           Major procedures and Radiology Reports - PLEASE review detailed and final reports thoroughly  -      TTE -  Left ventricle: The cavity size was normal. Systolic function was normal. The estimated ejection fraction was in the range of 50% to 55%. Wall motion was normal; there were no regional wall motion abnormalities. Left ventricular diastolic function parameters were normal. - Pericardium, extracardiac: There was no pericardial effusion.    Dg Chest 2 View  Result Date: 10/28/2017 CLINICAL DATA:  Mid chest pain for 4 hours, awoke from sleep with the pain, some shortness of breath, BILATERAL arm pain, smoker EXAM: CHEST - 2 VIEW COMPARISON:  03/30/2009 FINDINGS: Upper normal heart size. Mediastinal contours and pulmonary vascularity normal. Lungs clear. No pleural effusion or pneumothorax. Bones unremarkable. IMPRESSION: No acute abnormalities. Electronically Signed   By: Lavonia Dana M.D.   On: 10/28/2017 19:42    Micro Results     Recent Results (from the past 240 hour(s))  Respiratory Panel by PCR     Status: None   Collection Time: 10/29/17 12:47 AM  Result Value Ref Range Status   Adenovirus NOT DETECTED NOT DETECTED Final   Coronavirus 229E NOT DETECTED NOT DETECTED Final    Coronavirus HKU1 NOT DETECTED NOT DETECTED Final   Coronavirus NL63 NOT DETECTED NOT DETECTED Final   Coronavirus OC43 NOT DETECTED NOT DETECTED Final   Metapneumovirus NOT DETECTED NOT DETECTED Final   Rhinovirus / Enterovirus NOT DETECTED NOT DETECTED Final   Influenza A NOT  DETECTED NOT DETECTED Final   Influenza B NOT DETECTED NOT DETECTED Final   Parainfluenza Virus 1 NOT DETECTED NOT DETECTED Final   Parainfluenza Virus 2 NOT DETECTED NOT DETECTED Final   Parainfluenza Virus 3 NOT DETECTED NOT DETECTED Final   Parainfluenza Virus 4 NOT DETECTED NOT DETECTED Final   Respiratory Syncytial Virus NOT DETECTED NOT DETECTED Final   Bordetella pertussis NOT DETECTED NOT DETECTED Final   Chlamydophila pneumoniae NOT DETECTED NOT DETECTED Final   Mycoplasma pneumoniae NOT DETECTED NOT DETECTED Final    Comment: Performed at [facility], [place] [address], [place], [place] [address]    Today   Subjective    Steven Stuart today has no headache,no chest abdominal pain,no new weakness tingling or numbness, feels much better wants to go home today.     Objective   Blood pressure 116/63, pulse (!) 51, temperature 97.7 F (36.5 C), temperature source Oral, resp. rate (!) 21, height 5' 10"  (1.778 m), weight 87.4 kg, SpO2 99 %.   Intake/Output Summary (Last 24 hours) at 10/29/2017 1111 Last data filed at 10/28/2017 2300 Gross per 24 hour  Intake 360 ml  Output -  Net 360 ml    Exam  Awake Alert, Oriented x 3, No new F.N deficits, Normal affect [place].AT,PERRAL Supple Neck,No JVD, No cervical lymphadenopathy appriciated.  Symmetrical Chest wall movement, Good air movement bilaterally, CTAB RRR,No Gallops,Rubs or new Murmurs, No Parasternal Heave +ve B.Sounds, Abd Soft, Non tender, No organomegaly appriciated, No rebound -guarding or rigidity. No Cyanosis, Clubbing or edema, No new Rash or bruise   Data Review   CBC w Diff:  Lab Results  Component Value Date   WBC 7.4  10/28/2017   HGB 14.7 10/28/2017   HCT 43.9 10/28/2017   PLT 264 10/28/2017   LYMPHOPCT 15 04/30/2011   MONOPCT 12 04/30/2011   EOSPCT 0 04/30/2011   BASOPCT 0 04/30/2011    CMP:  Lab Results  Component Value Date   NA 137 10/29/2017   K 3.2 (L) 10/29/2017   CL 102 10/29/2017   CO2 28 10/29/2017   BUN 5 (L) 10/29/2017   CREATININE 1.01 10/29/2017   PROT 7.5 08/17/2013   ALBUMIN 4.4 08/17/2013   BILITOT 0.4 08/17/2013   ALKPHOS 93 08/17/2013   AST 18 08/17/2013   ALT 12 08/17/2013  .   Total Time in preparing paper work, data evaluation and todays exam - 16 minutes  Lala Lund M.D on 10/29/2017 at 11:11 AM  [facility]   Office  [phone]

## 2017-10-29 NOTE — Care Management Note (Signed)
Case Management Note  Patient Details  Name: Steven Stuart MRN: [id number] Date of Birth: [birth year]  Subjective/Objective:                    Action/Plan:  Patient provided with MATCH and override for colchicine. State he has transportation home.  Expected Discharge Date:  10/29/17               Expected Discharge Plan:  Home/Self Care  In-House Referral:     Discharge planning Services  CM Consult, [facility] Program  Post Acute Care Choice:    Choice offered to:     DME Arranged:    DME Agency:     HH Arranged:    HH Agency:     Status of Service:  Completed, signed off  If discussed at [employer], dates discussed:    Additional Comments:  Lawerance Sabal, RN 10/29/2017, 11:40 AM

## 2017-10-30 ENCOUNTER — Emergency Department (HOSPITAL_COMMUNITY)
Admission: EM | Admit: 2017-10-30 | Discharge: 2017-10-30 | Disposition: A | Discharge status: Home / Self Care | Payer: Self-pay | Attending: Emergency Medicine | Admitting: Emergency Medicine

## 2017-10-30 ENCOUNTER — Encounter (HOSPITAL_COMMUNITY): Payer: Self-pay | Admitting: *Deleted

## 2017-10-30 DIAGNOSIS — R1013 Epigastric pain: Secondary | ICD-10-CM | POA: Insufficient documentation

## 2017-10-30 DIAGNOSIS — Z5321 Procedure and treatment not carried out due to patient leaving prior to being seen by health care provider: Secondary | ICD-10-CM | POA: Insufficient documentation

## 2017-10-30 LAB — COMPREHENSIVE METABOLIC PANEL
ALBUMIN: 3.4 g/dL — AB (ref 3.5–5.0)
ALT: 23 U/L (ref 0–44)
ANION GAP: 9 (ref 5–15)
AST: 31 U/L (ref 15–41)
Alkaline Phosphatase: 77 U/L (ref 38–126)
BUN: 6 mg/dL (ref 6–20)
CO2: 22 mmol/L (ref 22–32)
Calcium: 9.3 mg/dL (ref 8.9–10.3)
Chloride: 106 mmol/L (ref 98–111)
Creatinine, Ser: 0.81 mg/dL (ref 0.61–1.24)
GFR calc non Af Amer: >60 mL/min (ref >60)
GLUCOSE: 108 mg/dL — AB (ref 70–99)
POTASSIUM: 3.9 mmol/L (ref 3.5–5.1)
SODIUM: 137 mmol/L (ref 135–145)
TOTAL PROTEIN: 7.2 g/dL (ref 6.5–8.1)
Total Bilirubin: 0.5 mg/dL (ref 0.3–1.2)

## 2017-10-30 LAB — CBC
HEMATOCRIT: 44.7 % (ref 39.0–52.0)
HEMOGLOBIN: 14.6 g/dL (ref 13.0–17.0)
MCH: 29.6 pg (ref 26.0–34.0)
MCHC: 32.7 g/dL (ref 30.0–36.0)
MCV: 90.5 fL (ref 78.0–100.0)
Platelets: 278 10*3/uL (ref 150–400)
RBC: 4.94 MIL/uL (ref 4.22–5.81)
RDW: 12.6 % (ref 11.5–15.5)
WBC: 7.3 10*3/uL (ref 4.0–10.5)

## 2017-10-30 LAB — LIPASE, BLOOD: Lipase: 38 U/L (ref 11–51)

## 2017-10-30 NOTE — ED Triage Notes (Signed)
Pt in via [facility], per report pt c/o epigastric pain onset this am with hx of similar symptoms and was seen and kept over night last wk, pt sent home with colchicine and protonix, pt c/o n/d, denies vomiting, A&O x4

## 2017-10-30 NOTE — ED Notes (Signed)
Phlebotomy reports pt is wanting to leave and does not want to stay in the lobby, pt does not stay long enough to offer remaining In the triage area in a recliner until a room is available, pt left after triage without being seen by a doctor

## 2017-11-08 ENCOUNTER — Ambulatory Visit (INDEPENDENT_AMBULATORY_CARE_PROVIDER_SITE_OTHER): Payer: Self-pay | Admitting: Cardiology

## 2017-11-08 ENCOUNTER — Encounter: Payer: Self-pay | Admitting: Cardiology

## 2017-11-08 VITALS — BP 120/76 | HR 104 | Ht 70.0 in | Wt 203.0 lb

## 2017-11-08 DIAGNOSIS — I319 Disease of pericardium, unspecified: Secondary | ICD-10-CM

## 2017-11-08 NOTE — Progress Notes (Signed)
Cardiology Office Note:    Date:  11/08/2017   ID:  Steven Stuart, DOB 1995-08-16, MRN [id number]  PCP:  Patient, No Pcp Per  Cardiologist:  Donato Schultz, MD  Electrophysiologist:  None   Referring MD: No ref. provider found     History of Present Illness:    Steven Stuart is a 22 y.o. male with acute myopericarditis on 10/29/2017 treated with colchicine ibuprofen.  Started feeling better immediately in the hospital.  Troponin 2.8.  Echocardiogram showed no pericardial effusion with normal ejection fraction 50 to 55%.  This was performed on 10/29/2017.  Overall he is been doing quite well, no significant symptoms.  Feeling well.  He is still completing his course of colchicine.  No fevers.  Past Medical History:  Diagnosis Date  . Acute myopericarditis 10/28/2017    History reviewed. No pertinent surgical history.  Current Medications: Current Meds  Medication Sig  . colchicine 0.6 MG tablet Take 1 tablet (0.6 mg total) by mouth 2 (two) times daily.  Marland Kitchen ibuprofen (ADVIL,MOTRIN) 600 MG tablet Take 1 tablet (600 mg total) by mouth 2 (two) times daily.  . pantoprazole (PROTONIX) 40 MG tablet Take 1 tablet (40 mg total) by mouth daily.     Allergies:   Patient has no known allergies.   Social History   Socioeconomic History  . Marital status: Single    Spouse name: Not on file  . Number of children: Not on file  . Years of education: Not on file  . Highest education level: Not on file  Occupational History  . Not on file  Social Needs  . Financial resource strain: Not on file  . Food insecurity:    Worry: Not on file    Inability: Not on file  . Transportation needs:    Medical: Not on file    Non-medical: Not on file  Tobacco Use  . Smoking status: Never Smoker  . Smokeless tobacco: Never Used  Substance and Sexual Activity  . Alcohol use: Yes  . Drug use: Yes    Types: Marijuana  . Sexual activity: Not on file  Lifestyle  . Physical activity:    Days per week:  Not on file    Minutes per session: Not on file  . Stress: Not on file  Relationships  . Social connections:    Talks on phone: Not on file    Gets together: Not on file    Attends religious service: Not on file    Active member of club or organization: Not on file    Attends meetings of clubs or organizations: Not on file    Relationship status: Not on file  Other Topics Concern  . Not on file  Social History Narrative  . Not on file     Family History: The patient's family history includes Heart attack (age of onset: 32) in his father.  ROS:   Please see the history of present illness.    No fevers chills nausea vomiting syncope bleeding all other systems reviewed and are negative.  EKGs/Labs/Other Studies Reviewed:    The following studies were reviewed today: Prior EKG echocardiogram, lab work hospitalization notes  EKG:  EKG is not ordered today.    Recent Labs: 10/29/2017: Magnesium 2.2 10/30/2017: ALT 23; BUN 6; Creatinine, Ser 0.81; Hemoglobin 14.6; Platelets 278; Potassium 3.9; Sodium 137  Recent Lipid Panel No results found for: CHOL, TRIG, HDL, CHOLHDL, VLDL, LDLCALC, LDLDIRECT  Physical Exam:    VS:  BP  120/76   Pulse (!) 104   Ht 5\' 10"  (1.778 m)   Wt 203 lb (92.1 kg)   SpO2 98%   BMI 29.13 kg/m     Wt Readings from Last 3 Encounters:  11/08/17 203 lb (92.1 kg)  10/29/17 192 lb 9.6 oz (87.4 kg)  06/09/17 180 lb (81.6 kg)     GEN:  Well nourished, well developed in no acute distress HEENT: Normal NECK: No JVD; No carotid bruits LYMPHATICS: No lymphadenopathy CARDIAC: RRR, no murmurs, rubs, gallops RESPIRATORY:  Clear to auscultation without rales, wheezing or rhonchi  ABDOMEN: Soft, non-tender, non-distended MUSCULOSKELETAL:  No edema; No deformity  SKIN: Warm and dry NEUROLOGIC:  Alert and oriented x 3 PSYCHIATRIC:  Normal affect   ASSESSMENT:    1. Myopericarditis    PLAN:    In order of problems listed above:  Myopericarditis -  Resolved.  Doing well.  Studies are reassuring.  Continue with colchicine until they are finished. He will let us know if he starts having any return of symptoms.  Gradually increase physical activity.  Encourage smoking cessation, THC positive in hospital  We can see him back on as-needed basis.  Medication Adjustments/Labs and Tests Ordered: Current medicines are reviewed at length with the patient today.  Concerns regarding medicines are outlined above.  No orders of the defined types were placed in this encounter.  No orders of the defined types were placed in this encounter.   There are no Patient Instructions on file for this visit.   Signed, Donato Schultz, MD  11/08/2017 4:24 PM    [facility]

## 2017-11-08 NOTE — Patient Instructions (Signed)
Medication Instructions:  Your physician recommends that you continue on your current medications as directed. Please refer to the Current Medication list given to you today.  If you need a refill on your cardiac medications before your next appointment, please call your pharmacy.   Lab work: NONE If you have labs (blood work) drawn today and your tests are completely normal, you will receive your results only by: Marland Kitchen MyChart Message (if you have MyChart) OR . A paper copy in the mail If you have any lab test that is abnormal or we need to change your treatment, we will call you to review the results.  Testing/Procedures: NONE  Follow-Up: Your physician recommends that you schedule a follow-up appointment AS NEEDED   Any Other Special Instructions Will Be Listed Below (If Applicable). NONE

## 2018-10-22 ENCOUNTER — Encounter (HOSPITAL_COMMUNITY): Payer: Self-pay | Admitting: Emergency Medicine

## 2018-10-22 ENCOUNTER — Emergency Department (HOSPITAL_COMMUNITY)
Admission: EM | Admit: 2018-10-22 | Discharge: 2018-10-22 | Disposition: A | Discharge status: Home / Self Care | Payer: Self-pay | Attending: Emergency Medicine | Admitting: Emergency Medicine

## 2018-10-22 ENCOUNTER — Other Ambulatory Visit: Payer: Self-pay

## 2018-10-22 DIAGNOSIS — M546 Pain in thoracic spine: Secondary | ICD-10-CM | POA: Insufficient documentation

## 2018-10-22 LAB — URINALYSIS, ROUTINE W REFLEX MICROSCOPIC
Bilirubin Urine: NEGATIVE
Glucose, UA: NEGATIVE mg/dL
Hgb urine dipstick: NEGATIVE
Ketones, ur: NEGATIVE mg/dL
Nitrite: NEGATIVE
Protein, ur: NEGATIVE mg/dL
Specific Gravity, Urine: 1.012 (ref 1.005–1.030)
pH: 6 (ref 5.0–8.0)

## 2018-10-22 MED ORDER — LIDO-CAPSAICIN-MEN-METHYL SAL 0.5-0.035-5-20 % EX PTCH: 1.0000 | MEDICATED_PATCH | Freq: Every day | CUTANEOUS | 0 refills | Status: DC | PRN

## 2018-10-22 MED ORDER — CYCLOBENZAPRINE HCL 10 MG PO TABS: 10.0000 mg | ORAL_TABLET | Freq: Two times a day (BID) | ORAL | 0 refills | Status: DC | PRN

## 2018-10-22 NOTE — Discharge Instructions (Addendum)
Your symptoms are most consistent with a back strain.  Please read includes information on back exercises and strains.   Back Pain:  I have prescribed flexeril a muscle relaxant.  Please keep in mind that muscle relaxer's can cause fatigue and should not be taken while at work or driving.  Back pain is very common.  The pain often gets better over time.  The cause of back pain is usually not dangerous.  Most people can learn to manage their back pain on their own.  However if you develop severe or worsening pain, low back pain with fever, numbness, weakness or inability to walk or urinate, you should return to the ER immediately.  Please follow up with your doctor this week for a recheck if still having symptoms.  Low back pain is discomfort in the lower back that may be due to injuries to muscles and ligaments around the spine.  Occasionally, it may be caused by a a problem to a part of the spine called a disc. The pain may last several days or a week;  However, most patients get completely well in 4 weeks.  Medications are also useful to help with pain control.  A commonly prescribed medications includes acetaminophen.  This medication is generally safe, though you should not take more than 8 of the extra strength (500mg ) pills a day.  Non steroidal anti inflammatory medications including Ibuprofen and naproxen;  These medications help both pain and swelling and are very useful in treating back pain.  They should be taken with food, as they can cause stomach upset, and more seriously, stomach bleeding.    You will need to follow up with  Your primary healthcare provider in 1-2 weeks for reassessment. I have provided information for the [place] [facility] wellness community if you do not have a primary care doctor.  Be aware that if you develop new symptoms, such as a fever, leg weakness, difficulty with or loss of control of your urine or bowels, abdominal pain, or more severe pain,  you will need to seek medical attention and  / or return to the Emergency department.    Home Care Stay active.  Start with short walks on flat ground if you can.  Try to walk farther each day. Do not sit, drive or stand in one place for more than 30 minutes.  Do not stay in bed. Do not avoid exercise or work.  Activity can help your back heal faster. Be careful when you bend or lift an object.  Bend at your knees, keep the object close to you, and do not twist. Sleep on a firm mattress.  Lie on your side, and bend your knees.  If you lie on your back, put a pillow under your knees. Only take medicines as told by your doctor. Put ice on the injured area. Put ice in a plastic bag Place a towel between your skin and the bag Leave the ice on for 15-20 minutes, 3-4 times a day for the first 2-3 days. 210 After that, you can switch between ice and heat packs. Ask your doctor about back exercises or massage. Avoid feeling anxious or stressed.  Find good ways to deal with stress, such as exercise.  Get Help Right Way If: Your pain does not go away with rest or medicine. Your pain does not go away in 1 week. You have new problems. You do not feel well. The pain spreads into your legs. You  cannot control when you poop (bowel movement) or pee (urinate) You feel sick to your stomach (nauseous) or throw up (vomit) You have belly (abdominal) pain. You feel like you may pass out (faint). If you develop a fever.  Make Sure you: Understand these instructions. Will watch your condition Will get help right away if you are not doing well or get worse.

## 2018-10-22 NOTE — ED Triage Notes (Signed)
Patient is complaining of right flank pain. No nausea or vomiting. Patient is not having any urinary problems.

## 2018-10-22 NOTE — ED Notes (Signed)
Pt discharged with no further questions. DC instructions reviewed.

## 2018-10-22 NOTE — ED Provider Notes (Addendum)
[facility]-EMERGENCY DEPT Provider Note   CSN: [id number] Arrival date & time: 10/22/18  0536     History   Chief Complaint Chief Complaint  Patient presents with  . Flank Pain    HPI Steven Stuart is a 23 y.o. male presenting with right lower rib cage pain that is dull, constant and achy in character, began suddenly at 3 AM this morning with 10/10 pain and progressively improved to 1/10 during ED wait.  Patient says pain is worse with deep breath is non-positional has taken no pain medication prior to presentation. Patient states he had to take shallow breaths when pain initially began this morning due to deep inhalation worsening the pain however states that breathing is not associated pain at this point.   Patient states he has had similar pain in the past but never this bad.  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  Patient states that he is able to get in comfortable positions where the pain is not as bad denies any specific position or pain is worse.  Patient denies any urinary symptoms, burning with urination, hematuria, bowel or bladder incontinence, any concern for STIs, or changes in bowel.  Denies any fevers, cough.      HPI  Past Medical History:  Diagnosis Date  . Acute myopericarditis 10/28/2017    Patient Active Problem List   Diagnosis Date Noted  . Acute myopericarditis 10/28/2017    History reviewed. No pertinent surgical history.      Home Medications    Prior to Admission medications   Medication Sig Start Date End Date Taking? Authorizing Provider  colchicine 0.6 MG tablet Take 1 tablet (0.6 mg total) by mouth 2 (two) times daily. Patient not taking: Reported on 10/22/2018 10/29/17   Leroy SeaSingh, Prashant K, MD  cyclobenzaprine (FLEXERIL) 10 MG tablet Take 1 tablet (10 mg total) by mouth 2 (two) times daily as needed for muscle spasms. 10/22/18   Gailen ShelterFondaw, Laelia Angelo S, PA  ibuprofen (ADVIL,MOTRIN) 600 MG tablet Take 1  tablet (600 mg total) by mouth 2 (two) times daily. Patient not taking: Reported on 10/22/2018 10/29/17   Leroy SeaSingh, Prashant K, MD  Lido-Capsaicin-Men-Methyl Sal 0.5-0.035-5-20 % PTCH Apply 1 patch topically daily as needed. 10/22/18   Gailen ShelterFondaw, Cyntia Staley S, PA  pantoprazole (PROTONIX) 40 MG tablet Take 1 tablet (40 mg total) by mouth daily. Patient not taking: Reported on 10/22/2018 10/29/17   Leroy SeaSingh, Prashant K, MD    Family History Family History  Problem Relation Age of Onset  . Heart attack Father [id number]    Social History Social History   Tobacco Use  . Smoking status: Never Smoker  . Smokeless tobacco: Never Used  Substance Use Topics  . Alcohol use: Yes  . Drug use: Yes    Types: Marijuana     Allergies   Patient has no known allergies.   Review of Systems Review of Systems  Constitutional: Negative for chills and fever.  HENT: Negative for congestion.   Eyes: Negative for pain.  Respiratory: Negative for cough and shortness of breath.   Cardiovascular: Negative for chest pain and leg swelling.  Gastrointestinal: Negative for abdominal pain and vomiting.  Genitourinary: Positive for flank pain. Negative for difficulty urinating, dysuria, frequency, hematuria, penile pain and urgency.  Musculoskeletal: Positive for back pain.  Skin: Negative for rash.  Neurological: Negative for dizziness and headaches.     Physical Exam Updated Vital Signs BP 134/78 (BP Location: Left  Arm)   Pulse 73   Temp 98.4 F (36.9 C) (Oral)   Resp 18   Ht 5\' 11"  (1.803 m)   Wt 90.7 kg   SpO2 99%   BMI 27.89 kg/m   Physical Exam Vitals signs and nursing note reviewed.  Constitutional:      General: He is not in acute distress.    Appearance: He is not ill-appearing.  HENT:     Head: Normocephalic and atraumatic.     Nose: Nose normal.  Eyes:     General: No scleral icterus. Neck:     Musculoskeletal: Normal range of motion.  Cardiovascular:     Rate and Rhythm: Normal rate and regular  rhythm.     Heart sounds: No murmur. No friction rub. No gallop.   Pulmonary:     Effort: Pulmonary effort is normal. No respiratory distress.     Breath sounds: No wheezing, rhonchi or rales.  Chest:     Chest wall: No tenderness.  Abdominal:     General: Abdomen is flat. Bowel sounds are normal. There is no distension.     Palpations: Abdomen is soft.     Tenderness: There is no abdominal tenderness.  Musculoskeletal:     Right lower leg: No edema.     Left lower leg: No edema.     Comments: Tenderness to palpation over right-sided paraspinal muscle.  Tenderness is reproducible and identical to pain experienced with deep breathing.  Skin:    General: Skin is warm and dry.  Neurological:     Mental Status: He is alert. Mental status is at baseline.  Psychiatric:        Mood and Affect: Mood normal.        Behavior: Behavior normal.      ED Treatments / Results  Labs (all labs ordered are listed, but only abnormal results are displayed) Labs Reviewed  URINALYSIS, ROUTINE W REFLEX MICROSCOPIC - Abnormal; Notable for the following components:      Result Value   Leukocytes,Ua SMALL (*)    Bacteria, UA RARE (*)    All other components within normal limits  URINE CULTURE    EKG None  Radiology No results found.  Procedures Procedures (including critical care time)  Medications Ordered in ED Medications - No data to display   Initial Impression / Assessment and Plan / ED Course  I have reviewed the triage vital signs and the nursing notes.  Pertinent labs & imaging results that were available during my care of the patient were reviewed by me and considered in my medical decision making (see chart for details).       23 year old male with history of myopericarditis presenting for acute onset of right sided flank/back pain that is similar to prior episodes but worse.  Patient description of pain is consistent with prior muscle strains he has had in the past.   Patient has urine with no blood present, patient denies any urinary symptoms, denies any saddle anesthesia, numbness, tingling in extremities or midline tenderness, no fever or IV drug use.  Patient states pain is improving and on physical exam was able to identify trigger point in the right middle back.  Doubt pulmonary embolism as he is PERC negative and pleuritic component of pain is no longer present.   Patient is understanding of plan to discharge home and agreeable to research trigger point therapy and pursue massage, use Tylenol ibuprofen and stretch.      The patient appears  reasonably screened and/or stabilized for discharge and I doubt any other medical condition or other [facility] requiring further screening, evaluation, or treatment in the ED at this time prior to discharge.  Patient is hemodynamically stable, in NAD, and able to ambulate in the ED. Pain has been managed or a plan has been made for home management and has no complaints prior to discharge. Patient is comfortable with above plan and is stable for discharge at this time. All questions were answered prior to disposition. Results from the ER workup discussed with the patient face to face and all questions answered to the best of my ability. The patient is safe for discharge with strict return precautions. Patient appears safe for discharge with appropriate follow-up.  Conveyed my impression with the patient and he voiced understanding and is agreeable to plan.   An After Visit Summary was printed and given to the patient.  Portions of this note were generated with Lobbyist. Dictation errors may occur despite best attempts at proofreading.       Final Clinical Impressions(s) / ED Diagnoses   Final diagnoses:  Acute right-sided thoracic back pain    ED Discharge Orders         Ordered    cyclobenzaprine (FLEXERIL) 10 MG tablet  2 times daily PRN     10/22/18 0940    Lido-Capsaicin-Men-Methyl Sal  0.5-0.035-5-20 % PTCH  Daily PRN     10/22/18 0940           Tedd Sias, PA 10/22/18 0941    Tedd Sias, PA 10/22/18 1431    Fredia Sorrow, MD 11/03/18 [phone]

## 2018-10-23 LAB — URINE CULTURE
Culture: <10000 — AB
Special Requests: NORMAL

## 2019-06-30 ENCOUNTER — Encounter (HOSPITAL_COMMUNITY): Payer: Self-pay

## 2019-06-30 ENCOUNTER — Ambulatory Visit (HOSPITAL_COMMUNITY)
Admission: EM | Admit: 2019-06-30 | Discharge: 2019-06-30 | Disposition: A | Discharge status: Home / Self Care | Payer: Self-pay | Attending: Physician Assistant | Admitting: Physician Assistant

## 2019-06-30 DIAGNOSIS — H1031 Unspecified acute conjunctivitis, right eye: Secondary | ICD-10-CM

## 2019-06-30 MED ORDER — CIPROFLOXACIN HCL 0.3 % OP SOLN: OPHTHALMIC | 0 refills | Status: AC

## 2019-06-30 NOTE — ED Triage Notes (Signed)
Pt c/o 8/10 right eye pain w/light sensitivity. Pt c/o blurred vision in right eye, itchinessx 3 days. Right eye is erythematous.

## 2019-10-31 ENCOUNTER — Emergency Department (HOSPITAL_COMMUNITY)
Admission: EM | Admit: 2019-10-31 | Discharge: 2019-11-25 | Disposition: E | Payer: Self-pay | Attending: Emergency Medicine | Admitting: Emergency Medicine

## 2019-10-31 ENCOUNTER — Encounter (HOSPITAL_COMMUNITY): Payer: Self-pay | Admitting: Emergency Medicine

## 2019-10-31 DIAGNOSIS — X58XXXA Exposure to other specified factors, initial encounter: Secondary | ICD-10-CM | POA: Insufficient documentation

## 2019-10-31 DIAGNOSIS — S0180XA Unspecified open wound of other part of head, initial encounter: Secondary | ICD-10-CM | POA: Insufficient documentation

## 2019-10-31 DIAGNOSIS — W3400XA Accidental discharge from unspecified firearms or gun, initial encounter: Secondary | ICD-10-CM

## 2019-10-31 MED ORDER — EPINEPHRINE 1 MG/10ML IJ SOSY
PREFILLED_SYRINGE | INTRAMUSCULAR | Status: DC | PRN
  Administered 2019-10-31 (×2): 1 via INTRAVENOUS

## 2019-10-31 MED FILL — Medication: Qty: 1 | Status: AC

## 2019-11-01 ENCOUNTER — Encounter (HOSPITAL_COMMUNITY): Payer: Self-pay

## 2019-11-02 LAB — TYPE AND SCREEN
Unit division: 0
Unit division: 0

## 2019-11-02 LAB — BPAM RBC
Blood Product Expiration Date: 202110252359
Blood Product Expiration Date: 202110252359
ISSUE DATE / TIME: 202110070825
ISSUE DATE / TIME: 202110070825
Unit Type and Rh: 5100
Unit Type and Rh: 5100
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
## 2019-11-25 NOTE — Progress Notes (Signed)
   Nov 05, 2019 1000  Clinical Encounter Type  Visited With Family  Visit Type Death  Referral From Nurse  Consult/Referral To Chaplain  Spiritual Encounters  Spiritual Needs Grief support;Emotional;Prayer  The chaplain responded to page concerning patient. The chaplain was not present when the doctor spoke to the patient's mom. The chaplain arrived shortly afterwards and was in the room with the patient's mom, [employer], and another [facility] staff member. Chaplain provided grief and emotional support. The Chaplain stayed with the patient's mother until the grandmother arrived. Shortly after the patient's father arrived. The chaplain will continue to provide spiritual care to the family. The family is unsure of the funeral home they will choose. The chaplain will continue to follow up with the house nurse as the family is requesting to view the body prior to leaving the hospital. The chaplain will support the family as needed.

## 2019-11-25 NOTE — Procedures (Signed)
   Procedure Note  Date: 11/15/19  Procedure: central venous catheter placement--right, femoral vein, without ultrasound guidance  Pre-op diagnosis: ACLS Post-op diagnosis: same  Surgeon: Diamantina Monks, MD  Anesthesia: none EBL: <5cc Drains/Implants:single lumen central venous catheter  Description of procedure: This procedure was performed emergently and therefore informed consent was not obtained and this procedure was not performed under sterile conditions. The right femoral vein was accessed using an introducer needle and a guidewire passed through the needle. The needle was removed and a skin nick was made. The tract was dilated and the central venous catheter advanced over the guidewire followed by removal of the guidewire. The port drew blood easily was immediately connected to blood tubing.   Diamantina Monks, MD General and Trauma Surgery [facility]
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
## 2019-11-25 NOTE — ED Notes (Signed)
Notified [facility], referral [phone] [facility]

## 2019-11-25 DEATH — deceased
